# Patient Record
Sex: Female | Born: 1935 | Race: White | Hispanic: No | Smoking: Never smoker
Health system: Southern US, Community
[De-identification: ages and names within clinical notes are randomized; demographics above are authoritative.]

## PROBLEM LIST (undated history)

## (undated) DIAGNOSIS — I10 Essential (primary) hypertension: Secondary | ICD-10-CM

## (undated) HISTORY — PX: EYE SURGERY: SHX253

## (undated) HISTORY — PX: ABDOMINAL HYSTERECTOMY: SHX81

---

## 1998-09-10 ENCOUNTER — Ambulatory Visit (HOSPITAL_COMMUNITY): Admission: RE | Admit: 1998-09-10 | Discharge: 1998-09-10 | Payer: Self-pay | Admitting: Obstetrics & Gynecology

## 2021-10-01 ENCOUNTER — Other Ambulatory Visit: Payer: Self-pay

## 2021-10-01 ENCOUNTER — Emergency Department (HOSPITAL_BASED_OUTPATIENT_CLINIC_OR_DEPARTMENT_OTHER)
Admission: EM | Admit: 2021-10-01 | Discharge: 2021-10-01 | Disposition: A | Discharge status: Home / Self Care | Payer: Medicare Other | Attending: Emergency Medicine | Admitting: Emergency Medicine

## 2021-10-01 ENCOUNTER — Emergency Department (HOSPITAL_BASED_OUTPATIENT_CLINIC_OR_DEPARTMENT_OTHER): Payer: Medicare Other

## 2021-10-01 ENCOUNTER — Other Ambulatory Visit (HOSPITAL_BASED_OUTPATIENT_CLINIC_OR_DEPARTMENT_OTHER): Payer: Self-pay

## 2021-10-01 ENCOUNTER — Encounter (HOSPITAL_BASED_OUTPATIENT_CLINIC_OR_DEPARTMENT_OTHER): Payer: Self-pay | Admitting: Emergency Medicine

## 2021-10-01 DIAGNOSIS — U071 COVID-19: Secondary | ICD-10-CM | POA: Insufficient documentation

## 2021-10-01 DIAGNOSIS — R000 Tachycardia, unspecified: Secondary | ICD-10-CM | POA: Diagnosis not present

## 2021-10-01 DIAGNOSIS — I10 Essential (primary) hypertension: Secondary | ICD-10-CM | POA: Diagnosis not present

## 2021-10-01 DIAGNOSIS — R059 Cough, unspecified: Secondary | ICD-10-CM | POA: Diagnosis present

## 2021-10-01 HISTORY — DX: Essential (primary) hypertension: I10

## 2021-10-01 LAB — RESP PANEL BY RT-PCR (FLU A&B, COVID) ARPGX2
Influenza A by PCR: NEGATIVE
Influenza B by PCR: NEGATIVE
SARS Coronavirus 2 by RT PCR: POSITIVE — AB

## 2021-10-01 LAB — CBC WITH DIFFERENTIAL/PLATELET
Abs Immature Granulocytes: 0 10*3/uL (ref 0.00–0.07)
Basophils Absolute: 0 10*3/uL (ref 0.0–0.1)
Basophils Relative: 1 %
Eosinophils Absolute: 0 10*3/uL (ref 0.0–0.5)
Eosinophils Relative: 1 %
HCT: 39.3 % (ref 36.0–46.0)
Hemoglobin: 12.8 g/dL (ref 12.0–15.0)
Immature Granulocytes: 0 %
Lymphocytes Relative: 7 %
Lymphs Abs: 0.4 10*3/uL — ABNORMAL LOW (ref 0.7–4.0)
MCH: 30.3 pg (ref 26.0–34.0)
MCHC: 32.6 g/dL (ref 30.0–36.0)
MCV: 93.1 fL (ref 80.0–100.0)
Monocytes Absolute: 0.5 10*3/uL (ref 0.1–1.0)
Monocytes Relative: 8 %
Neutro Abs: 5 10*3/uL (ref 1.7–7.7)
Neutrophils Relative %: 83 %
Platelets: 220 10*3/uL (ref 150–400)
RBC: 4.22 MIL/uL (ref 3.87–5.11)
RDW: 12.8 % (ref 11.5–15.5)
WBC: 5.9 10*3/uL (ref 4.0–10.5)
nRBC: 0 % (ref 0.0–0.2)

## 2021-10-01 LAB — URINALYSIS, ROUTINE W REFLEX MICROSCOPIC
Bilirubin Urine: NEGATIVE
Glucose, UA: NEGATIVE mg/dL
Ketones, ur: NEGATIVE mg/dL
Leukocytes,Ua: NEGATIVE
Nitrite: NEGATIVE
Protein, ur: NEGATIVE mg/dL
Specific Gravity, Urine: 1.015 (ref 1.005–1.030)
pH: 7 (ref 5.0–8.0)

## 2021-10-01 LAB — BASIC METABOLIC PANEL
Anion gap: 10 (ref 5–15)
BUN: 10 mg/dL (ref 8–23)
CO2: 23 mmol/L (ref 22–32)
Calcium: 9.3 mg/dL (ref 8.9–10.3)
Chloride: 106 mmol/L (ref 98–111)
Creatinine, Ser: 0.83 mg/dL (ref 0.44–1.00)
GFR, Estimated: >60 mL/min (ref >60)
Glucose, Bld: 119 mg/dL — ABNORMAL HIGH (ref 70–99)
Potassium: 4.1 mmol/L (ref 3.5–5.1)
Sodium: 139 mmol/L (ref 135–145)

## 2021-10-01 LAB — URINALYSIS, MICROSCOPIC (REFLEX)

## 2021-10-01 MED ORDER — KETOROLAC TROMETHAMINE 30 MG/ML IJ SOLN
30.0000 mg | Freq: Once | INTRAMUSCULAR | Status: AC
  Administered 2021-10-01: 30 mg via INTRAVENOUS
  Filled 2021-10-01: qty 1

## 2021-10-01 MED ORDER — SODIUM CHLORIDE 0.9 % IV BOLUS
1000.0000 mL | Freq: Once | INTRAVENOUS | Status: AC
  Administered 2021-10-01: 1000 mL via INTRAVENOUS

## 2021-10-01 MED ORDER — NIRMATRELVIR/RITONAVIR (PAXLOVID)TABLET
3.0000 | ORAL_TABLET | Freq: Two times a day (BID) | ORAL | 0 refills | Status: AC
  Filled 2021-10-01: qty 30, 5d supply, fill #0

## 2021-10-01 NOTE — ED Provider Notes (Signed)
[facility] EMERGENCY DEPARTMENT Provider Note   CSN: [id number] Arrival date & time: 10/01/21  0630     History Chief Complaint  Patient presents with   Tachycardia   URI    Kelsey Clarke is a 86 y.o. female.  Pt presents to the ED today with coughing, body aches, and weakness.  Pt said sx started early this am.  She said her HR seems like it is going very fast.  Pt said it got up to 125 bpm.  Pt also has left side pain.  Pt has not taken anything for her sx.  She did take her usual bp med today.  Pt has been vaccinated for the flu.  She had 2 doses of the Covid vaccine, but no boosters because of a blood clot behind her eye after the vaccine.   No known sick contacts.      Past Medical History:  Diagnosis Date   Hypertension     There are no problems to display for this patient.   Past Surgical History:  Procedure Laterality Date   ABDOMINAL HYSTERECTOMY     EYE SURGERY       OB History   No obstetric history on file.     History reviewed. No pertinent family history.  Social History   Tobacco Use   Smoking status: Never   Smokeless tobacco: Never  Vaping Use   Vaping Use: Never used  Substance Use Topics   Alcohol use: Never   Drug use: Never    Home Medications Prior to Admission medications   Medication Sig Start Date End Date Taking? Authorizing Provider  nirmatrelvir/ritonavir EUA (PAXLOVID) 20 x 150 MG & 10 x 100MG  TABS Take 3 tablets by mouth 2 (two) times daily for 5 days. Patient GFR is >60. Take nirmatrelvir (150 mg) two tablets twice daily for 5 days and ritonavir (100 mg) one tablet twice daily for 5 days. 10/01/21 10/06/21 Yes 10/08/21, MD    Allergies    Patient has no known allergies.  Review of Systems   Review of Systems  Constitutional:  Positive for fatigue and fever.  Respiratory:  Positive for cough.   Cardiovascular:  Positive for palpitations.  Musculoskeletal:  Positive for myalgias.  All other  systems reviewed and are negative.  Physical Exam Updated Vital Signs BP (!) 133/56   Pulse 90   Temp 98.7 F (37.1 C) (Oral)   Resp 20   Ht 5' 3.5" (1.613 m)   Wt 59 kg   SpO2 99%   BMI 22.67 kg/m   Physical Exam Vitals and nursing note reviewed.  Constitutional:      Appearance: Normal appearance.  HENT:     Head: Normocephalic and atraumatic.     Right Ear: External ear normal.     Left Ear: External ear normal.     Nose: Nose normal.     Mouth/Throat:     Mouth: Mucous membranes are dry.  Eyes:     Extraocular Movements: Extraocular movements intact.     Conjunctiva/sclera: Conjunctivae normal.     Pupils: Pupils are equal, round, and reactive to light.  Cardiovascular:     Rate and Rhythm: Normal rate and regular rhythm.     Pulses: Normal pulses.     Heart sounds: Normal heart sounds.  Pulmonary:     Effort: Pulmonary effort is normal.  Abdominal:     General: Abdomen is flat. Bowel sounds are normal.     Palpations:  Abdomen is soft.  Musculoskeletal:        General: Normal range of motion.     Cervical back: Normal range of motion and neck supple.  Skin:    General: Skin is warm.     Capillary Refill: Capillary refill takes less than 2 seconds.  Neurological:     General: No focal deficit present.     Mental Status: She is alert and oriented to person, place, and time.  Psychiatric:        Mood and Affect: Mood normal.        Behavior: Behavior normal.    ED Results / Procedures / Treatments   Labs (all labs ordered are listed, but only abnormal results are displayed) Labs Reviewed  RESP PANEL BY RT-PCR (FLU A&B, COVID) ARPGX2 - Abnormal; Notable for the following components:      Result Value   SARS Coronavirus 2 by RT PCR POSITIVE (*)    All other components within normal limits  BASIC METABOLIC PANEL - Abnormal; Notable for the following components:   Glucose, Bld 119 (*)    All other components within normal limits  CBC WITH  DIFFERENTIAL/PLATELET - Abnormal; Notable for the following components:   Lymphs Abs 0.4 (*)    All other components within normal limits  URINALYSIS, ROUTINE W REFLEX MICROSCOPIC - Abnormal; Notable for the following components:   Hgb urine dipstick TRACE (*)    All other components within normal limits  URINALYSIS, MICROSCOPIC (REFLEX) - Abnormal; Notable for the following components:   Bacteria, UA RARE (*)    Non Squamous Epithelial PRESENT (*)    All other components within normal limits    EKG EKG Interpretation  Date/Time:  Thursday October 01 2021 06:45:33 EST Ventricular Rate:  104 PR Interval:  147 QRS Duration: 86 QT Interval:  322 QTC Calculation: 424 R Axis:   66 Text Interpretation: Sinus tachycardia Minimal ST depression, diffuse leads No previous ECGs available Confirmed by Paula Libra ([id number]) on 10/01/2021 6:48:31 AM  Radiology DG Chest Portable 1 View  Result Date: 10/01/2021 CLINICAL DATA:  Cough headaches EXAM: PORTABLE CHEST 1 VIEW COMPARISON:  None. FINDINGS: The heart size and mediastinal contours are within normal limits. Both lungs are clear. The visualized skeletal structures are unremarkable. IMPRESSION: No active disease. Electronically Signed   By: Ernie Avena M.D.   On: 10/01/2021 08:49    Procedures Procedures   Medications Ordered in ED Medications  sodium chloride 0.9 % bolus 1,000 mL (1,000 mLs Intravenous New Bag/Given 10/01/21 0737)  ketorolac (TORADOL) 30 MG/ML injection 30 mg (30 mg Intravenous Given 10/01/21 8299)    ED Course  I have reviewed the triage vital signs and the nursing notes.  Pertinent labs & imaging results that were available during my care of the patient were reviewed by me and considered in my medical decision making (see chart for details).    MDM Rules/Calculators/A&P                           Pt is feeling better after meds and fluids.  HR is down.  Pt is + for Covid.  She is stable for d/c.  She is  interested in Paxlovid.  She is stable for d/c.  Return if worse.  F/u with pcp.  Kelsey Clarke was evaluated in Emergency Department on 10/01/2021 for the symptoms described in the history of present illness. She was evaluated in the context  of the global COVID-19 pandemic, which necessitated consideration that the patient might be at risk for infection with the SARS-CoV-2 virus that causes COVID-19. Institutional protocols and algorithms that pertain to the evaluation of patients at risk for COVID-19 are in a state of rapid change based on information released by regulatory bodies including the CDC and federal and state organizations. These policies and algorithms were followed during the patient's care in the ED.  Final Clinical Impression(s) / ED Diagnoses Final diagnoses:  COVID-19    Rx / DC Orders ED Discharge Orders          Ordered    nirmatrelvir/ritonavir EUA (PAXLOVID) 20 x 150 MG & 10 x 100MG  TABS  2 times daily        10/01/21 0905             13/10/22, MD 10/01/21 [phone]

## 2021-10-01 NOTE — ED Triage Notes (Addendum)
Pt reports last night she started coughing, body aches, headache and generalized weakness. Pt states this morning she feels like her heart is beating fast.

## 2021-10-01 NOTE — Discharge Instructions (Signed)
Person Under Monitoring Name: Kelsey Clarke  Location: [address] [place] [address]   Infection Prevention Recommendations for Individuals Confirmed to have, or Being Evaluated for, 2019 Novel Coronavirus (COVID-19) Infection Who Receive Care at Home  Individuals who are confirmed to have, or are being evaluated for, COVID-19 should follow the prevention steps below until a healthcare provider or local or state health department says they can return to normal activities.  Stay home except to get medical care You should restrict activities outside your home, except for getting medical care. Do not go to work, school, or public areas, and do not use public transportation or taxis.  Call ahead before visiting your doctor Before your medical appointment, call the healthcare provider and tell them that you have, or are being evaluated for, COVID-19 infection. This will help the healthcare provider's office take steps to keep other people from getting infected. Ask your healthcare provider to call the local or state health department.  Monitor your symptoms Seek prompt medical attention if your illness is worsening (e.g., difficulty breathing). Before going to your medical appointment, call the healthcare provider and tell them that you have, or are being evaluated for, COVID-19 infection. Ask your healthcare provider to call the local or state health department.  Wear a facemask You should wear a facemask that covers your nose and mouth when you are in the same room with other people and when you visit a healthcare provider. People who live with or visit you should also wear a facemask while they are in the same room with you.  Separate yourself from other people in your home As much as possible, you should stay in a different room from other people in your home. Also, you should use a separate bathroom, if available.  Avoid sharing household items You should not  share dishes, drinking glasses, cups, eating utensils, towels, bedding, or other items with other people in your home. After using these items, you should wash them thoroughly with soap and water.  Cover your coughs and sneezes Cover your mouth and nose with a tissue when you cough or sneeze, or you can cough or sneeze into your sleeve. Throw used tissues in a lined trash can, and immediately wash your hands with soap and water for at least 20 seconds or use an alcohol-based hand rub.  Wash your [employer] your hands often and thoroughly with soap and water for at least 20 seconds. You can use an alcohol-based hand sanitizer if soap and water are not available and if your hands are not visibly dirty. Avoid touching your eyes, nose, and mouth with unwashed hands.   Prevention Steps for Caregivers and Household Members of Individuals Confirmed to have, or Being Evaluated for, COVID-19 Infection Being Cared for in the Home  If you live with, or provide care at home for, a person confirmed to have, or being evaluated for, COVID-19 infection please follow these guidelines to prevent infection:  Follow healthcare provider's instructions Make sure that you understand and can help the patient follow any healthcare provider instructions for all care.  Provide for the patient's basic needs You should help the patient with basic needs in the home and provide support for getting groceries, prescriptions, and other personal needs.  Monitor the patient's symptoms If they are getting sicker, call his or her medical provider and tell them that the patient has, or is being evaluated for, COVID-19 infection. This will help the healthcare  provider's office take steps to keep other people from getting infected. Ask the healthcare provider to call the local or state health department.  Limit the number of people who have contact with the patient If possible, have only one caregiver for the  patient. Other household members should stay in another home or place of residence. If this is not possible, they should stay in another room, or be separated from the patient as much as possible. Use a separate bathroom, if available. Restrict visitors who do not have an essential need to be in the home.  Keep older adults, very young children, and other sick people away from the patient Keep older adults, very young children, and those who have compromised immune systems or chronic health conditions away from the patient. This includes people with chronic heart, lung, or kidney conditions, diabetes, and cancer.  Ensure good ventilation Make sure that shared spaces in the home have good air flow, such as from an air conditioner or an opened window, weather permitting.  Wash your hands often Wash your hands often and thoroughly with soap and water for at least 20 seconds. You can use an alcohol based hand sanitizer if soap and water are not available and if your hands are not visibly dirty. Avoid touching your eyes, nose, and mouth with unwashed hands. Use disposable paper towels to dry your hands. If not available, use dedicated cloth towels and replace them when they become wet.  Wear a facemask and gloves Wear a disposable facemask at all times in the room and gloves when you touch or have contact with the patient's blood, body fluids, and/or secretions or excretions, such as sweat, saliva, sputum, nasal mucus, vomit, urine, or feces.  Ensure the mask fits over your nose and mouth tightly, and do not touch it during use. Throw out disposable facemasks and gloves after using them. Do not reuse. Wash your hands immediately after removing your facemask and gloves. If your personal clothing becomes contaminated, carefully remove clothing and launder. Wash your hands after handling contaminated clothing. Place all used disposable facemasks, gloves, and other waste in a lined container before  disposing them with other household waste. Remove gloves and wash your hands immediately after handling these items.  Do not share dishes, glasses, or other household items with the patient Avoid sharing household items. You should not share dishes, drinking glasses, cups, eating utensils, towels, bedding, or other items with a patient who is confirmed to have, or being evaluated for, COVID-19 infection. After the person uses these items, you should wash them thoroughly with soap and water.  Wash laundry thoroughly Immediately remove and wash clothes or bedding that have blood, body fluids, and/or secretions or excretions, such as sweat, saliva, sputum, nasal mucus, vomit, urine, or feces, on them. Wear gloves when handling laundry from the patient. Read and follow directions on labels of laundry or clothing items and detergent. In general, wash and dry with the warmest temperatures recommended on the label.  Clean all areas the individual has used often Clean all touchable surfaces, such as counters, tabletops, doorknobs, bathroom fixtures, toilets, phones, keyboards, tablets, and bedside tables, every day. Also, clean any surfaces that may have blood, body fluids, and/or secretions or excretions on them. Wear gloves when cleaning surfaces the patient has come in contact with. Use a diluted bleach solution (e.g., dilute bleach with 1 part bleach and 10 parts water) or a household disinfectant with a label that says EPA-registered for coronaviruses. To make  a bleach solution at home, add 1 tablespoon of bleach to 1 quart (4 cups) of water. For a larger supply, add  cup of bleach to 1 gallon (16 cups) of water. Read labels of cleaning products and follow recommendations provided on product labels. Labels contain instructions for safe and effective use of the cleaning product including precautions you should take when applying the product, such as wearing gloves or eye protection and making sure you  have good ventilation during use of the product. Remove gloves and wash hands immediately after cleaning.  Monitor yourself for signs and symptoms of illness Caregivers and household members are considered close contacts, should monitor their health, and will be asked to limit movement outside of the home to the extent possible. Follow the monitoring steps for close contacts listed on the symptom monitoring form.   ? If you have additional questions, contact your local health department or call the epidemiologist on call at [phone] (available 24/7). ? This guidance is subject to change. For the most up-to-date guidance from [facility], please refer to their website: [email or web address]

## 2022-11-24 IMAGING — DX DG CHEST 1V PORT
1 series · 1 of 1 positions shown · non-contrast
Comparison: None.

CLINICAL DATA: Cough headaches

EXAM:
PORTABLE CHEST 1 VIEW

[chest ap]
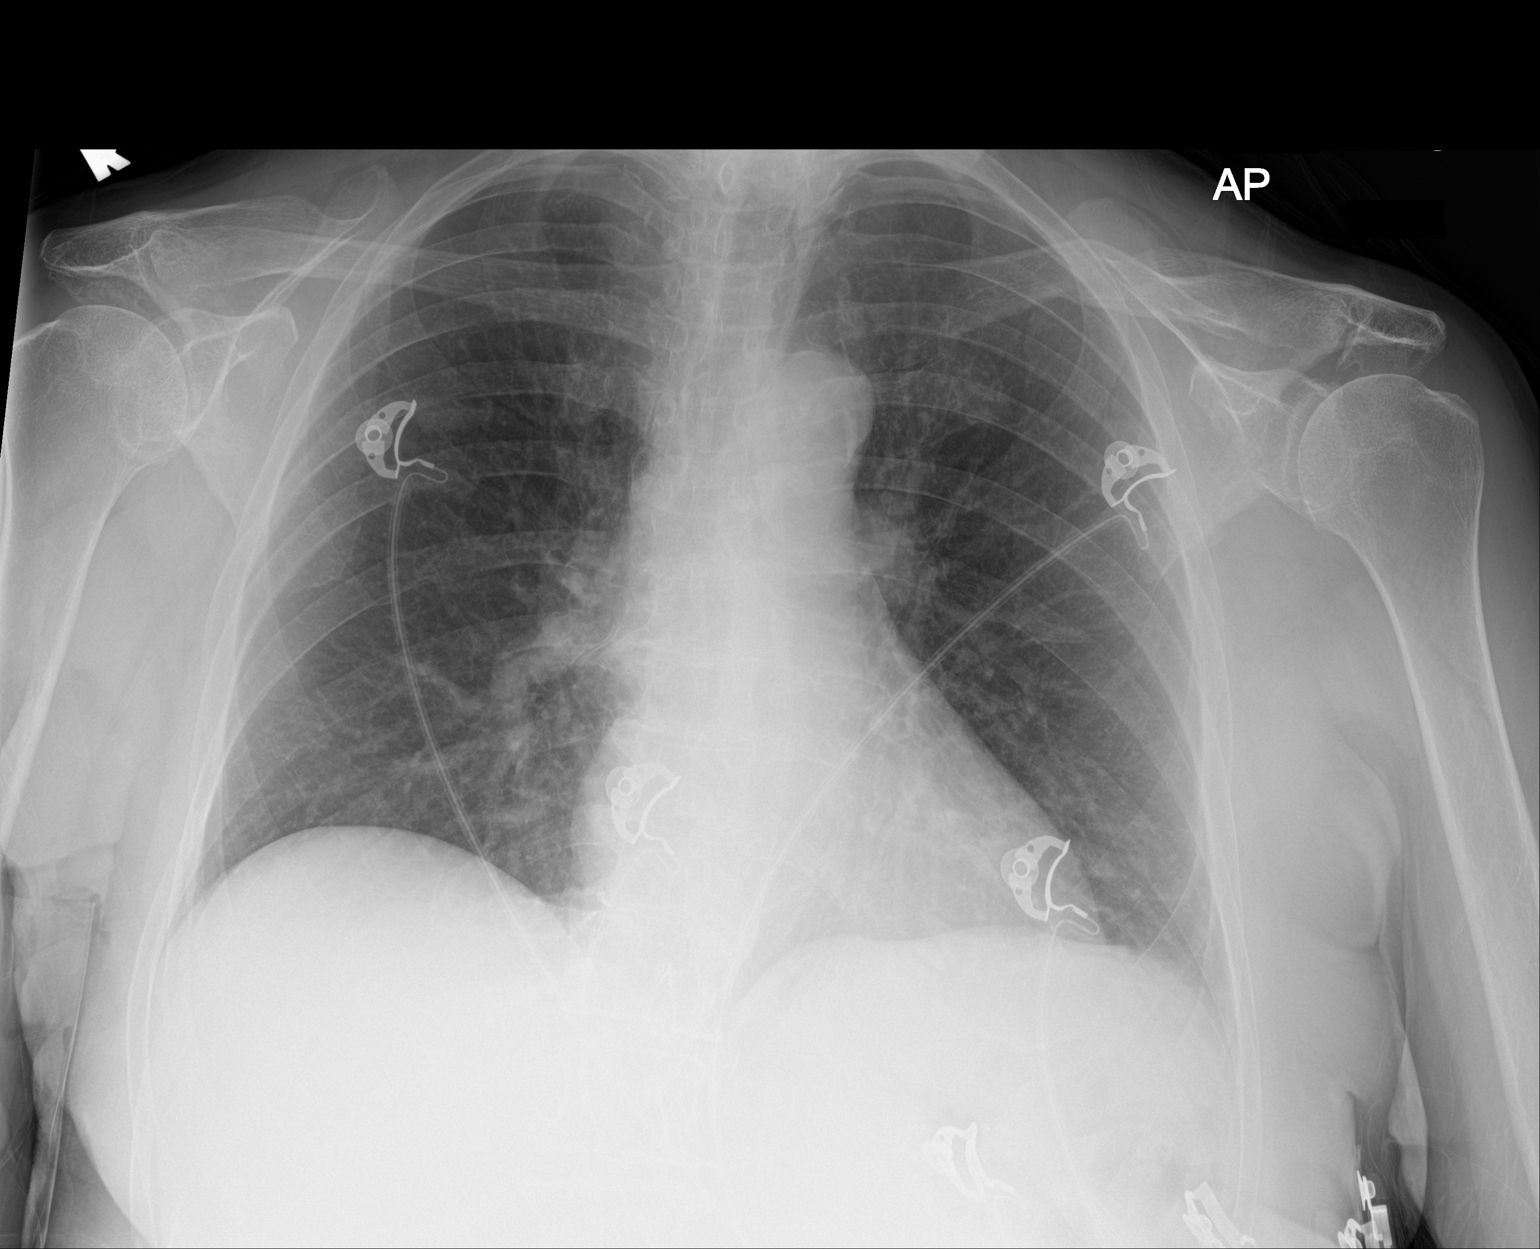

[1 of 1 positions shown; findings below may reference images not displayed]

FINDINGS: The heart size and mediastinal contours are within normal limits.
Both lungs are clear. The visualized skeletal structures are
unremarkable.
IMPRESSION: No active disease.
